# Patient Record
Sex: Female | Born: 1960 | Race: White | Hispanic: No | Marital: Single | Smoking: Former smoker
Health system: Southern US, Community
[De-identification: ages and names within clinical notes are randomized; demographics above are authoritative.]

## PROBLEM LIST (undated history)

## (undated) DIAGNOSIS — M719 Bursopathy, unspecified: Secondary | ICD-10-CM

## (undated) DIAGNOSIS — M797 Fibromyalgia: Secondary | ICD-10-CM

## (undated) DIAGNOSIS — M1990 Unspecified osteoarthritis, unspecified site: Secondary | ICD-10-CM

---

## 2000-03-13 ENCOUNTER — Other Ambulatory Visit: Admission: RE | Admit: 2000-03-13 | Discharge: 2000-03-13 | Payer: Self-pay | Admitting: Obstetrics and Gynecology

## 2001-04-02 ENCOUNTER — Other Ambulatory Visit: Admission: RE | Admit: 2001-04-02 | Discharge: 2001-04-02 | Payer: Self-pay | Admitting: Obstetrics and Gynecology

## 2003-05-09 ENCOUNTER — Encounter: Payer: Self-pay | Admitting: Obstetrics and Gynecology

## 2003-05-09 ENCOUNTER — Encounter: Admission: RE | Admit: 2003-05-09 | Discharge: 2003-05-09 | Payer: Self-pay | Admitting: Obstetrics and Gynecology

## 2006-06-18 ENCOUNTER — Encounter: Admission: RE | Admit: 2006-06-18 | Discharge: 2006-06-18 | Payer: Self-pay | Admitting: Obstetrics and Gynecology

## 2007-03-17 ENCOUNTER — Encounter (INDEPENDENT_AMBULATORY_CARE_PROVIDER_SITE_OTHER): Payer: Self-pay | Admitting: Family Medicine

## 2007-03-17 ENCOUNTER — Ambulatory Visit: Payer: Self-pay | Admitting: *Deleted

## 2007-03-17 ENCOUNTER — Ambulatory Visit (HOSPITAL_COMMUNITY): Admission: RE | Admit: 2007-03-17 | Discharge: 2007-03-17 | Payer: Self-pay | Admitting: Family Medicine

## 2007-07-06 ENCOUNTER — Encounter: Admission: RE | Admit: 2007-07-06 | Discharge: 2007-07-06 | Payer: Self-pay | Admitting: Obstetrics and Gynecology

## 2008-07-12 ENCOUNTER — Encounter: Admission: RE | Admit: 2008-07-12 | Discharge: 2008-07-12 | Payer: Self-pay | Admitting: Obstetrics and Gynecology

## 2009-08-06 ENCOUNTER — Encounter: Admission: RE | Admit: 2009-08-06 | Discharge: 2009-08-06 | Payer: Self-pay | Admitting: Obstetrics and Gynecology

## 2010-04-26 ENCOUNTER — Emergency Department (HOSPITAL_COMMUNITY): Admission: EM | Admit: 2010-04-26 | Discharge: 2010-04-26 | Payer: Self-pay | Admitting: Emergency Medicine

## 2010-10-22 ENCOUNTER — Encounter
Admission: RE | Admit: 2010-10-22 | Discharge: 2010-10-22 | Payer: Self-pay | Source: Home / Self Care | Attending: Obstetrics and Gynecology | Admitting: Obstetrics and Gynecology

## 2011-01-05 LAB — URINALYSIS, ROUTINE W REFLEX MICROSCOPIC
Ketones, ur: NEGATIVE mg/dL
Nitrite: NEGATIVE
pH: 6 (ref 5.0–8.0)

## 2011-01-05 LAB — DIFFERENTIAL
Basophils Relative: 1 % (ref 0–1)
Eosinophils Absolute: 0 10*3/uL (ref 0.0–0.7)
Eosinophils Relative: 0 % (ref 0–5)
Lymphs Abs: 2 10*3/uL (ref 0.7–4.0)
Monocytes Absolute: 0.4 10*3/uL (ref 0.1–1.0)
Monocytes Relative: 6 % (ref 3–12)
Neutrophils Relative %: 65 % (ref 43–77)

## 2011-01-05 LAB — BASIC METABOLIC PANEL
BUN: 12 mg/dL (ref 6–23)
Creatinine, Ser: 0.72 mg/dL (ref 0.4–1.2)
GFR calc Af Amer: >60 mL/min (ref >60)
GFR calc non Af Amer: >60 mL/min (ref >60)
Potassium: 4 mEq/L (ref 3.5–5.1)

## 2011-01-05 LAB — CBC
HCT: 40.3 % (ref 36.0–46.0)
MCH: 32.8 pg (ref 26.0–34.0)
MCHC: 35 g/dL (ref 30.0–36.0)
MCV: 93.7 fL (ref 78.0–100.0)
WBC: 6.9 10*3/uL (ref 4.0–10.5)

## 2011-01-05 LAB — POCT CARDIAC MARKERS: Myoglobin, poc: 63 ng/mL (ref 12–200)

## 2011-10-17 ENCOUNTER — Other Ambulatory Visit: Payer: Self-pay | Admitting: Obstetrics and Gynecology

## 2011-10-17 DIAGNOSIS — Z1231 Encounter for screening mammogram for malignant neoplasm of breast: Secondary | ICD-10-CM

## 2011-11-04 ENCOUNTER — Ambulatory Visit
Admission: RE | Admit: 2011-11-04 | Discharge: 2011-11-04 | Disposition: A | Discharge status: Home / Self Care | Payer: BC Managed Care – PPO | Source: Ambulatory Visit | Attending: Obstetrics and Gynecology | Admitting: Obstetrics and Gynecology

## 2011-11-04 DIAGNOSIS — Z1231 Encounter for screening mammogram for malignant neoplasm of breast: Secondary | ICD-10-CM

## 2012-11-24 ENCOUNTER — Other Ambulatory Visit: Payer: Self-pay | Admitting: Obstetrics and Gynecology

## 2012-11-24 DIAGNOSIS — Z1231 Encounter for screening mammogram for malignant neoplasm of breast: Secondary | ICD-10-CM

## 2012-12-13 ENCOUNTER — Ambulatory Visit: Payer: BC Managed Care – PPO

## 2012-12-17 ENCOUNTER — Ambulatory Visit
Admission: RE | Admit: 2012-12-17 | Discharge: 2012-12-17 | Disposition: A | Discharge status: Home / Self Care | Payer: BC Managed Care – PPO | Source: Ambulatory Visit | Attending: Obstetrics and Gynecology | Admitting: Obstetrics and Gynecology

## 2012-12-17 DIAGNOSIS — Z1231 Encounter for screening mammogram for malignant neoplasm of breast: Secondary | ICD-10-CM

## 2014-01-31 ENCOUNTER — Other Ambulatory Visit: Payer: Self-pay

## 2014-01-31 DIAGNOSIS — Z1231 Encounter for screening mammogram for malignant neoplasm of breast: Secondary | ICD-10-CM

## 2014-02-09 ENCOUNTER — Ambulatory Visit
Admission: RE | Admit: 2014-02-09 | Discharge: 2014-02-09 | Disposition: A | Discharge status: Home / Self Care | Payer: BC Managed Care – PPO | Source: Ambulatory Visit

## 2014-02-09 DIAGNOSIS — Z1231 Encounter for screening mammogram for malignant neoplasm of breast: Secondary | ICD-10-CM

## 2014-10-11 ENCOUNTER — Encounter (HOSPITAL_COMMUNITY): Payer: Self-pay

## 2014-10-11 ENCOUNTER — Emergency Department (HOSPITAL_COMMUNITY)
Admission: EM | Admit: 2014-10-11 | Discharge: 2014-10-11 | Disposition: A | Discharge status: Home / Self Care | Payer: BC Managed Care – PPO | Attending: Emergency Medicine | Admitting: Emergency Medicine

## 2014-10-11 ENCOUNTER — Emergency Department (HOSPITAL_COMMUNITY): Payer: BC Managed Care – PPO

## 2014-10-11 DIAGNOSIS — R079 Chest pain, unspecified: Secondary | ICD-10-CM

## 2014-10-11 DIAGNOSIS — I493 Ventricular premature depolarization: Secondary | ICD-10-CM | POA: Insufficient documentation

## 2014-10-11 DIAGNOSIS — R002 Palpitations: Secondary | ICD-10-CM

## 2014-10-11 DIAGNOSIS — J019 Acute sinusitis, unspecified: Secondary | ICD-10-CM

## 2014-10-11 DIAGNOSIS — R42 Dizziness and giddiness: Secondary | ICD-10-CM | POA: Insufficient documentation

## 2014-10-11 DIAGNOSIS — Z8739 Personal history of other diseases of the musculoskeletal system and connective tissue: Secondary | ICD-10-CM | POA: Insufficient documentation

## 2014-10-11 HISTORY — DX: Fibromyalgia: M79.7

## 2014-10-11 LAB — BASIC METABOLIC PANEL
Anion gap: 11 (ref 5–15)
BUN: 12 mg/dL (ref 6–23)
CO2: 22 mmol/L (ref 19–32)
Calcium: 9.3 mg/dL (ref 8.4–10.5)
Chloride: 109 mEq/L (ref 96–112)
Creatinine, Ser: 0.79 mg/dL (ref 0.50–1.10)
Glucose, Bld: 109 mg/dL — ABNORMAL HIGH (ref 70–99)
POTASSIUM: 4 mmol/L (ref 3.5–5.1)
SODIUM: 142 mmol/L (ref 135–145)

## 2014-10-11 LAB — CBC
HCT: 44.1 % (ref 36.0–46.0)
Hemoglobin: 14.5 g/dL (ref 12.0–15.0)
MCH: 29.8 pg (ref 26.0–34.0)
MCHC: 32.9 g/dL (ref 30.0–36.0)
MCV: 90.6 fL (ref 78.0–100.0)
Platelets: 219 10*3/uL (ref 150–400)
RBC: 4.87 MIL/uL (ref 3.87–5.11)
RDW: 13.1 % (ref 11.5–15.5)
WBC: 6.8 10*3/uL (ref 4.0–10.5)

## 2014-10-11 LAB — I-STAT TROPONIN, ED: TROPONIN I, POC: 0 ng/mL (ref 0.00–0.08)

## 2014-10-11 MED ORDER — IBUPROFEN 800 MG PO TABS
800.0000 mg | ORAL_TABLET | Freq: Once | ORAL | Status: AC
  Administered 2014-10-11: 800 mg via ORAL
  Filled 2014-10-11: qty 1

## 2014-10-11 MED ORDER — ONDANSETRON 4 MG PO TBDP: 4.0000 mg | ORAL_TABLET | Freq: Three times a day (TID) | ORAL | Status: AC | PRN

## 2014-10-11 MED ORDER — MECLIZINE HCL 25 MG PO TABS
50.0000 mg | ORAL_TABLET | Freq: Once | ORAL | Status: AC
  Administered 2014-10-11: 50 mg via ORAL
  Filled 2014-10-11: qty 2

## 2014-10-11 MED ORDER — IBUPROFEN 800 MG PO TABS: 800.0000 mg | ORAL_TABLET | Freq: Three times a day (TID) | ORAL | Status: AC | PRN

## 2014-10-11 MED ORDER — DEXAMETHASONE 4 MG PO TABS
10.0000 mg | ORAL_TABLET | Freq: Once | ORAL | Status: AC
  Administered 2014-10-11: 10 mg via ORAL
  Filled 2014-10-11: qty 3

## 2014-10-11 MED ORDER — ONDANSETRON 4 MG PO TBDP
4.0000 mg | ORAL_TABLET | Freq: Once | ORAL | Status: AC
  Administered 2014-10-11: 4 mg via ORAL
  Filled 2014-10-11: qty 1

## 2014-10-11 MED ORDER — MECLIZINE HCL 50 MG PO TABS: 50.0000 mg | ORAL_TABLET | Freq: Three times a day (TID) | ORAL | Status: AC | PRN

## 2014-10-11 NOTE — ED Provider Notes (Signed)
TIME SEEN: 2:50 PM  CHIEF COMPLAINT: Palpitations, sinus congestion, vertigo, nausea  HPI: Pt is a 53 y.o. F with history of fibromyalgia, prior history of tobacco use for 30 years who quit 3 years ago presents to the emergency department with complaints of not feeling well for the past several days. States that she has had a week of sinus congestion, sinus drainage into her oropharynx. She's also felt lightheaded and tired. States that over the past several days she has had vertigo with lying flat. No vertigo with turning her head or walking. No vertigo with sitting still. She states that she does have bilateral maxillary sinus pressure. No fevers or chills. No cough. She has had nausea but no vomiting or diarrhea. She also reports that she has felt palpitations for the past several days. States these episodes do not last more than 3-4 seconds. No prior history of arrhythmia. No history of coronary artery disease. She denies any chest pain or shortness of breath. No history of PE or DVT, lower extremity swelling or pain on exogenous estrogen use, recent prolonged immobilization such as long flight or hospitalization, fracture, injury, trauma. Denies any ear pain, tinnitus or hearing loss. Denies numbness, tingling or focal weakness. No head injury.  ROS: See HPI Constitutional: no fever  Eyes: no drainage  ENT: no runny nose   Cardiovascular:  no chest pain  Resp: no SOB  GI: no vomiting GU: no dysuria Integumentary: no rash  Allergy: no hives  Musculoskeletal: no leg swelling  Neurological: no slurred speech ROS otherwise negative  PAST MEDICAL HISTORY/PAST SURGICAL HISTORY:  Past Medical History  Diagnosis Date  . Fibromyalgia     MEDICATIONS:  Prior to Admission medications   Not on File    ALLERGIES:  No Known Allergies  SOCIAL HISTORY:  History  Substance Use Topics  . Smoking status: Never Smoker   . Smokeless tobacco: Not on file  . Alcohol Use: No    FAMILY  HISTORY: History reviewed. No pertinent family history.  EXAM: BP 139/79 mmHg  Pulse 64  Temp(Src) 97.5 F (36.4 C) (Oral)  Resp 14  Ht 5' (1.524 m)  Wt 165 lb (74.844 kg)  BMI 32.22 kg/m2  SpO2 99% CONSTITUTIONAL: Alert and oriented and responds appropriately to questions. Well-appearing; well-nourished HEAD: Normocephalic EYES: Conjunctivae clear, PERRL ENT: normal nose; no rhinorrhea; moist mucous membranes; pharynx without lesions noted, clear nasal drainage in her posterior oropharynx, no tonsillar hypertrophy or exudate, no stridor, no trismus or drooling, no uvular deviation NECK: Supple, no meningismus, no LAD  CARD: RRR; S1 and S2 appreciated; no murmurs, no clicks, no rubs, no gallops; occasional PVCs seen on the monitor RESP: Normal chest excursion without splinting or tachypnea; breath sounds clear and equal bilaterally; no wheezes, no rhonchi, no rales, no hypoxia ABD/GI: Normal bowel sounds; non-distended; soft, non-tender, no rebound, no guarding BACK:  The back appears normal and is non-tender to palpation, there is no CVA tenderness EXT: Normal ROM in all joints; non-tender to palpation; no edema; normal capillary refill; no cyanosis    SKIN: Normal color for age and race; warm NEURO: Moves all extremities equally; sensation to light touch intact diffusely, cranial nerves II through XII intact, normal gait PSYCH: The patient's mood and manner are appropriate. Grooming and personal hygiene are appropriate.  MEDICAL DECISION MAKING: Patient here with complaints of sinus headache, vertigo only with lying flat, nasal congestion. No fever. On exam she does have some very mild maxillary sinus tenderness present  otherwise well-appearing, nontoxic. Have discussed with patient that I feel like this is viral in nature at this time I do not feel that she needs antibiotics. She is using Flonase intermittently. Have started hurting and using this medicine consistently. We'll give a dose  of Decadron in the ED for symptom relief, ibuprofen, meclizine and Zofran. She is neurologically intact currently. She has no risk factors for CVA other than a prior history of tobacco use. I do not feel she needs an MRI of her brain. She is also complaining of feeling palpitations and well is in the room discussing this with patient she did have occasional PVCs. Suspect this is the cause of her symptoms as she reports becoming symptomatic when these episodes occurred on the monitor. Her electrolytes are normal. Troponin negative. Chest x-ray is clear. She states that she has been more stressed than normal and she recently lost her job in October. She states she does drink a large amount of caffeine every day, 4-6 glasses of sweet tea. Denies any other stimulant use. I doubt that this is ACS or pulmonary embolus given she has no chest pain or shortness of breath. Her EKG is nonischemic. Have as is her to avoid caffeine, drink plenty of fluids and she can follow-up with her primary care physician. Discussed strict return precautions. She verbalized understanding and is comfortable with plan.       Layla MawKristen N Trinten Boudoin, DO 10/11/14 [phone]

## 2014-10-11 NOTE — Discharge Instructions (Signed)
Palpitations A palpitation is the feeling that your heartbeat is irregular or is faster than normal. It may feel like your heart is fluttering or skipping a beat. Palpitations are usually not a serious problem. However, in some cases, you may need further medical evaluation. CAUSES  Palpitations can be caused by:  Smoking.  Caffeine or other stimulants, such as diet pills or energy drinks.  Alcohol.  Stress and anxiety.  Strenuous physical activity.  Fatigue.  Certain medicines.  Heart disease, especially if you have a history of irregular heart rhythms (arrhythmias), such as atrial fibrillation, atrial flutter, or supraventricular tachycardia.  An improperly working pacemaker or defibrillator. DIAGNOSIS  To find the cause of your palpitations, your health care provider will take your medical history and perform a physical exam. Your health care provider may also have you take a test called an ambulatory electrocardiogram (ECG). An ECG records your heartbeat patterns over a 24-hour period. You may also have other tests, such as:  Transthoracic echocardiogram (TTE). During echocardiography, sound waves are used to evaluate how blood flows through your heart.  Transesophageal echocardiogram (TEE).  Cardiac monitoring. This allows your health care provider to monitor your heart rate and rhythm in real time.  Holter monitor. This is a portable device that records your heartbeat and can help diagnose heart arrhythmias. It allows your health care provider to track your heart activity for several days, if needed.  Stress tests by exercise or by giving medicine that makes the heart beat faster. TREATMENT  Treatment of palpitations depends on the cause of your symptoms and can vary greatly. Most cases of palpitations do not require any treatment other than time, relaxation, and monitoring your symptoms. Other causes, such as atrial fibrillation, atrial flutter, or supraventricular  tachycardia, usually require further treatment. HOME CARE INSTRUCTIONS   Avoid:  Caffeinated coffee, tea, soft drinks, diet pills, and energy drinks.  Chocolate.  Alcohol.  Stop smoking if you smoke.  Reduce your stress and anxiety. Things that can help you relax include:  A method of controlling things in your body, such as your heartbeats, with your mind (biofeedback).  Yoga.  Meditation.  Physical activity such as swimming, jogging, or walking.  Get plenty of rest and sleep. SEEK MEDICAL CARE IF:   You continue to have a fast or irregular heartbeat beyond 24 hours.  Your palpitations occur more often. SEEK IMMEDIATE MEDICAL CARE IF:  You have chest pain or shortness of breath.  You have a severe headache.  You feel dizzy or you faint. MAKE SURE YOU:  Understand these instructions.  Will watch your condition.  Will get help right away if you are not doing well or get worse. Document Released: 10/03/2000 Document Revised: 10/11/2013 Document Reviewed: 12/05/2011 [facility] Patient Information 2015 [place], [place]. This information is not intended to replace advice given to you by your health care provider. Make sure you discuss any questions you have with your health care provider.  Sinusitis Sinusitis is redness, soreness, and inflammation of the paranasal sinuses. Paranasal sinuses are air pockets within the bones of your face (beneath the eyes, the middle of the forehead, or above the eyes). In healthy paranasal sinuses, mucus is able to drain out, and air is able to circulate through them by way of your nose. However, when your paranasal sinuses are inflamed, mucus and air can become trapped. This can allow bacteria and other germs to grow and cause infection. Sinusitis can develop quickly and last only a short time (acute)  or continue over a long period (chronic). Sinusitis that lasts for more than 12 weeks is considered chronic.  CAUSES  Causes of sinusitis  include:  Allergies.  Structural abnormalities, such as displacement of the cartilage that separates your nostrils (deviated septum), which can decrease the air flow through your nose and sinuses and affect sinus drainage.  Functional abnormalities, such as when the small hairs (cilia) that line your sinuses and help remove mucus do not work properly or are not present. SIGNS AND SYMPTOMS  Symptoms of acute and chronic sinusitis are the same. The primary symptoms are pain and pressure around the affected sinuses. Other symptoms include:  Upper toothache.  Earache.  Headache.  Bad breath.  Decreased sense of smell and taste.  A cough, which worsens when you are lying flat.  Fatigue.  Fever.  Thick drainage from your nose, which often is green and may contain pus (purulent).  Swelling and warmth over the affected sinuses. DIAGNOSIS  Your health care provider will perform a physical exam. During the exam, your health care provider may:  Look in your nose for signs of abnormal growths in your nostrils (nasal polyps).  Tap over the affected sinus to check for signs of infection.  View the inside of your sinuses (endoscopy) using an imaging device that has a light attached (endoscope). If your health care provider suspects that you have chronic sinusitis, one or more of the following tests may be recommended:  Allergy tests.  Nasal culture. A sample of mucus is taken from your nose, sent to a lab, and screened for bacteria.  Nasal cytology. A sample of mucus is taken from your nose and examined by your health care provider to determine if your sinusitis is related to an allergy. TREATMENT  Most cases of acute sinusitis are related to a viral infection and will resolve on their own within 10 days. Sometimes medicines are prescribed to help relieve symptoms (pain medicine, decongestants, nasal steroid sprays, or saline sprays).  However, for sinusitis related to a bacterial  infection, your health care provider will prescribe antibiotic medicines. These are medicines that will help kill the bacteria causing the infection.  Rarely, sinusitis is caused by a fungal infection. In theses cases, your health care provider will prescribe antifungal medicine. For some cases of chronic sinusitis, surgery is needed. Generally, these are cases in which sinusitis recurs more than 3 times per year, despite other treatments. HOME CARE INSTRUCTIONS   Drink plenty of water. Water helps thin the mucus so your sinuses can drain more easily.  Use a humidifier.  Inhale steam 3 to 4 times a day (for example, sit in the bathroom with the shower running).  Apply a warm, moist washcloth to your face 3 to 4 times a day, or as directed by your health care provider.  Use saline nasal sprays to help moisten and clean your sinuses.  Take medicines only as directed by your health care provider.  If you were prescribed either an antibiotic or antifungal medicine, finish it all even if you start to feel better. SEEK IMMEDIATE MEDICAL CARE IF:  You have increasing pain or severe headaches.  You have nausea, vomiting, or drowsiness.  You have swelling around your face.  You have vision problems.  You have a stiff neck.  You have difficulty breathing. MAKE SURE YOU:   Understand these instructions.  Will watch your condition.  Will get help right away if you are not doing well or get worse. Document  Released: 10/06/2005 Document Revised: 02/20/2014 Document Reviewed: 10/21/2011 [facility] Patient Information 2015 [place], [place]. This information is not intended to replace advice given to you by your health care provider. Make sure you discuss any questions you have with your health care provider.  Vertigo Vertigo means you feel like you or your surroundings are moving when they are not. Vertigo can be dangerous if it occurs when you are at work, driving, or performing difficult  activities.  CAUSES  Vertigo occurs when there is a conflict of signals sent to your brain from the visual and sensory systems in your body. There are many different causes of vertigo, including:  Infections, especially in the inner ear.  A bad reaction to a drug or misuse of alcohol and medicines.  Withdrawal from drugs or alcohol.  Rapidly changing positions, such as lying down or rolling over in bed.  A migraine headache.  Decreased blood flow to the brain.  Increased pressure in the brain from a head injury, infection, tumor, or bleeding. SYMPTOMS  You may feel as though the world is spinning around or you are falling to the ground. Because your balance is upset, vertigo can cause nausea and vomiting. You may have involuntary eye movements (nystagmus). DIAGNOSIS  Vertigo is usually diagnosed by physical exam. If the cause of your vertigo is unknown, your caregiver may perform imaging tests, such as an MRI scan (magnetic resonance imaging). TREATMENT  Most cases of vertigo resolve on their own, without treatment. Depending on the cause, your caregiver may prescribe certain medicines. If your vertigo is related to body position issues, your caregiver may recommend movements or procedures to correct the problem. In rare cases, if your vertigo is caused by certain inner ear problems, you may need surgery. HOME CARE INSTRUCTIONS   Follow your caregiver's instructions.  Avoid driving.  Avoid operating heavy machinery.  Avoid performing any tasks that would be dangerous to you or others during a vertigo episode.  Tell your caregiver if you notice that certain medicines seem to be causing your vertigo. Some of the medicines used to treat vertigo episodes can actually make them worse in some people. SEEK IMMEDIATE MEDICAL CARE IF:   Your medicines do not relieve your vertigo or are making it worse.  You develop problems with talking, walking, weakness, or using your arms, hands, or  legs.  You develop severe headaches.  Your nausea or vomiting continues or gets worse.  You develop visual changes.  A family member notices behavioral changes.  Your condition gets worse. MAKE SURE YOU:  Understand these instructions.  Will watch your condition.  Will get help right away if you are not doing well or get worse. Document Released: 07/16/2005 Document Revised: 12/29/2011 Document Reviewed: 04/24/2011 [facility] Patient Information 2015 [place], [place]. This information is not intended to replace advice given to you by your health care provider. Make sure you discuss any questions you have with your health care provider.  Premature Ventricular Contraction Premature ventricular contraction (PVC) is an irregularity of the heart rhythm involving extra or skipped heartbeats. In some cases, they may occur without obvious cause or heart disease. Other times, they can be caused by an electrolyte change in the blood. These need to be corrected. They can also be seen when there is not enough oxygen going to the heart. A common cause of this is plaque or cholesterol buildup. This buildup decreases the blood supply to the heart. In addition, extra beats may be caused or aggravated by:  Excessive smoking.  Alcohol consumption.  Caffeine.  Certain medications  Some street drugs. SYMPTOMS   The sensation of feeling your heart skipping a beat (palpitations).  In many cases, the person may have no symptoms. SIGNS AND TESTS   A physical examination may show an occasional irregularity, but if the PVC beats do not happen often, they may not be found on physical exam.  Blood pressure is usually normal.  Other tests that may find extra beats of the heart are:  An EKG (electrocardiogram)  A Holter monitor which can monitor your heart over longer periods of time  An Angiogram (study of the heart arteries). TREATMENT  Usually extra heartbeats do not need treatment. The  condition is treated only if symptoms are severe or if extra beats are very frequent or are causing problems. An underlying cause, if discovered, may also require treatment.  Treatment may also be needed if there may be a risk for other more serious cardiac arrhythmias.  PREVENTION   Moderation in caffeine, alcohol, and tobacco use may reduce the risk of ectopic heartbeats in some people.  Exercise often helps people who lead a sedentary (inactive) lifestyle. PROGNOSIS  PVC heartbeats are generally harmless and do not need treatment.  RISKS AND COMPLICATIONS   Ventricular tachycardia (occasionally).  There usually are no complications.  Other arrhythmias (occasionally). SEEK IMMEDIATE MEDICAL CARE IF:   You feel palpitations that are frequent or continual.  You develop chest pain or other problems such as shortness of breath, sweating, or nausea and vomiting.  You become light-headed or faint (pass out).  You get worse or do not improve with treatment. Document Released: 05/23/2004 Document Revised: 12/29/2011 Document Reviewed: 12/03/2007 [facility] Patient Information 2015 [place], [place]. This information is not intended to replace advice given to you by your health care provider. Make sure you discuss any questions you have with your health care provider.

## 2014-10-11 NOTE — ED Notes (Signed)
Pt from home with chest pressure and face pain that started several days ago.  Pt sts pain in face started first "feels like my sinuses are congested" and that chest pain started later.  Pt describes pain as pressure in nature no radiation.  Sts that she has been feeling dizzy with chest pain/pressure.  No other s/s reported.

## 2014-10-11 NOTE — ED Notes (Signed)
Pt comfortable with discharge and follow up instructions. Pt declines wheelchair, escorted to waiting area by this RN. Prescriptions x3. 

## 2015-12-03 ENCOUNTER — Encounter (HOSPITAL_COMMUNITY): Payer: Self-pay | Admitting: Emergency Medicine

## 2015-12-03 ENCOUNTER — Emergency Department (HOSPITAL_COMMUNITY)
Admission: EM | Admit: 2015-12-03 | Discharge: 2015-12-03 | Disposition: A | Discharge status: Home / Self Care | Payer: Commercial Managed Care - PPO | Attending: Emergency Medicine | Admitting: Emergency Medicine

## 2015-12-03 DIAGNOSIS — R1031 Right lower quadrant pain: Secondary | ICD-10-CM | POA: Diagnosis present

## 2015-12-03 LAB — COMPREHENSIVE METABOLIC PANEL
ALT: 24 U/L (ref 14–54)
AST: 24 U/L (ref 15–41)
Albumin: 3.7 g/dL (ref 3.5–5.0)
Alkaline Phosphatase: 75 U/L (ref 38–126)
Anion gap: 13 (ref 5–15)
BUN: 14 mg/dL (ref 6–20)
CALCIUM: 9.3 mg/dL (ref 8.9–10.3)
CHLORIDE: 105 mmol/L (ref 101–111)
CO2: 24 mmol/L (ref 22–32)
CREATININE: 0.78 mg/dL (ref 0.44–1.00)
Glucose, Bld: 89 mg/dL (ref 65–99)
Potassium: 3.8 mmol/L (ref 3.5–5.1)
SODIUM: 142 mmol/L (ref 135–145)
Total Bilirubin: 0.5 mg/dL (ref 0.3–1.2)
Total Protein: 7.1 g/dL (ref 6.5–8.1)

## 2015-12-03 LAB — CBC
HCT: 42.7 % (ref 36.0–46.0)
Hemoglobin: 14.2 g/dL (ref 12.0–15.0)
MCH: 30.3 pg (ref 26.0–34.0)
MCHC: 33.3 g/dL (ref 30.0–36.0)
MCV: 91 fL (ref 78.0–100.0)
Platelets: 217 K/uL (ref 150–400)
RBC: 4.69 MIL/uL (ref 3.87–5.11)
RDW: 13.6 % (ref 11.5–15.5)
WBC: 7.6 K/uL (ref 4.0–10.5)

## 2015-12-03 LAB — LIPASE, BLOOD: Lipase: 26 U/L (ref 11–51)

## 2015-12-03 NOTE — ED Notes (Signed)
Pt states since yesterday she has had a sharp pain in her RLQ and now is just really sore. PCP sent pt here to rule out appendicitis. Pt denies any n/v/d.

## 2015-12-04 ENCOUNTER — Ambulatory Visit
Admission: RE | Admit: 2015-12-04 | Discharge: 2015-12-04 | Disposition: A | Discharge status: Home / Self Care | Payer: Commercial Managed Care - PPO | Source: Ambulatory Visit | Attending: Family Medicine | Admitting: Family Medicine

## 2015-12-04 ENCOUNTER — Other Ambulatory Visit: Payer: Self-pay | Admitting: Family Medicine

## 2015-12-04 DIAGNOSIS — R1084 Generalized abdominal pain: Secondary | ICD-10-CM

## 2015-12-04 MED ORDER — IOPAMIDOL (ISOVUE-300) INJECTION 61%
100.0000 mL | Freq: Once | INTRAVENOUS | Status: AC | PRN
  Administered 2015-12-04: 100 mL via INTRAVENOUS

## 2017-07-10 IMAGING — CT CT ABD-PELV W/ CM
1 of 3 series · 13 of 32 positions shown, 18 images · IV contrast (APPLIED)
Comparison: None.

CLINICAL DATA: Right lower abdomen pain for 3 days

EXAM:
CT ABDOMEN AND PELVIS WITH CONTRAST
TECHNIQUE: Multidetector CT imaging of the abdomen and pelvis was performed
using the standard protocol following bolus administration of
intravenous contrast.
CONTRAST:  100mL 32LVT4-AWW IOPAMIDOL (32LVT4-AWW) INJECTION 61%

[Series 2: abd/pelvis w/cm · axial · 0.75mm/px · z∈[-449,-39]mm · 13 of 92 slices shown, 18 images]
[im 5/92  soft-tissue]
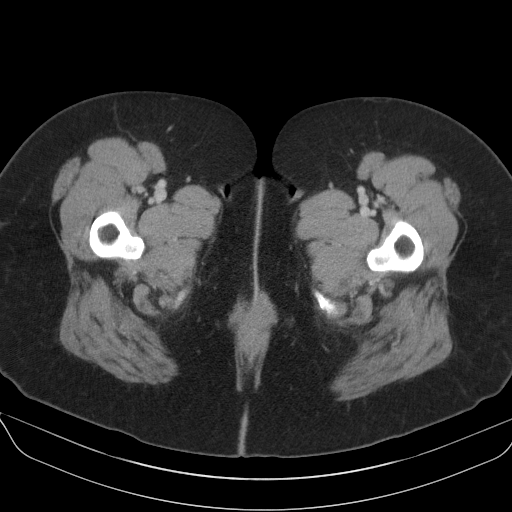
[im 5/92  bone]
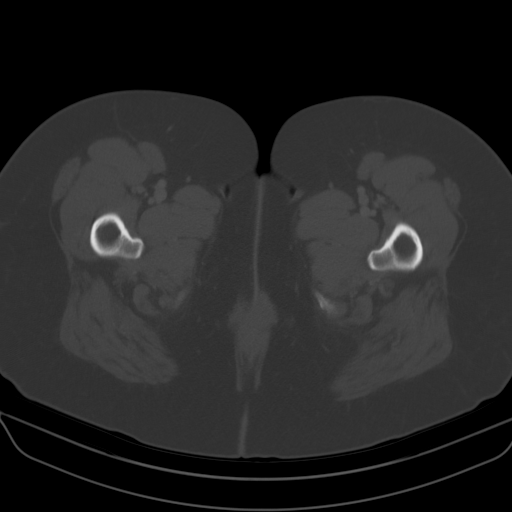
[im 14/92  soft-tissue]
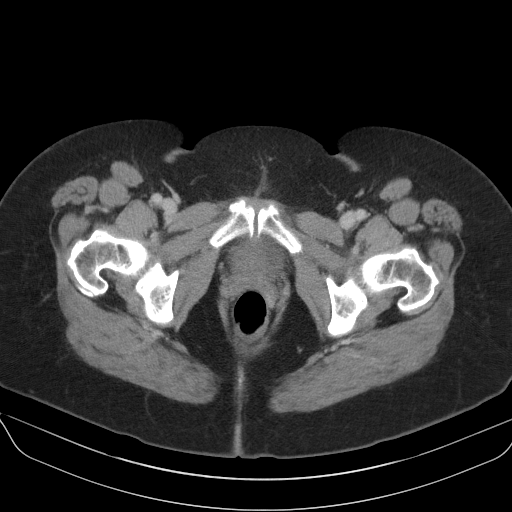
[im 19/92  soft-tissue]
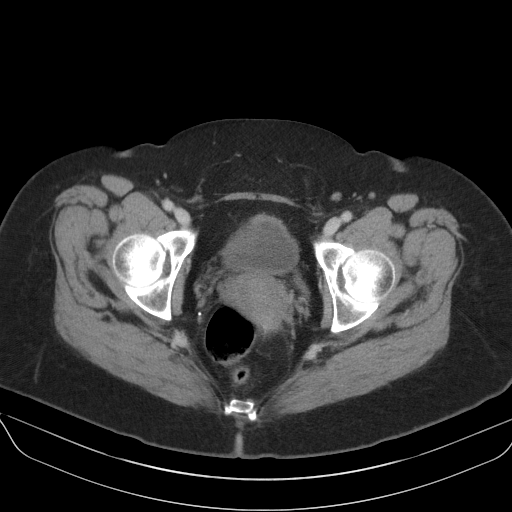
[im 28/92  soft-tissue]
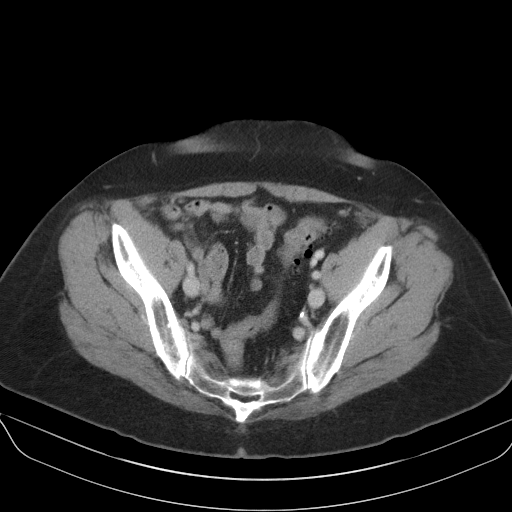
[im 37/92  soft-tissue]
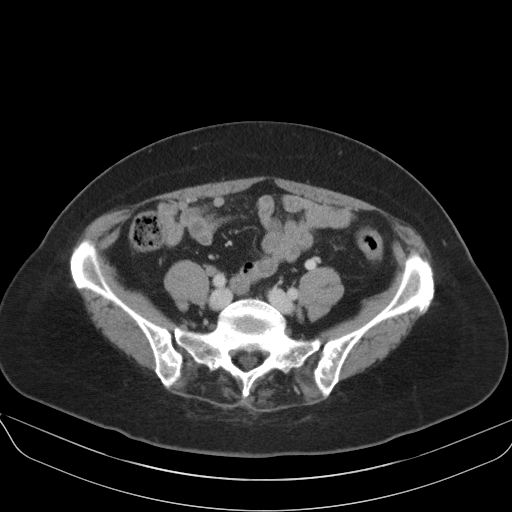
[im 41/92  soft-tissue]
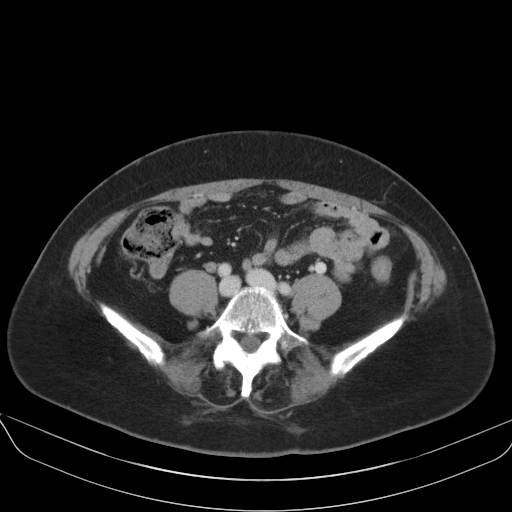
[im 51/92  soft-tissue]
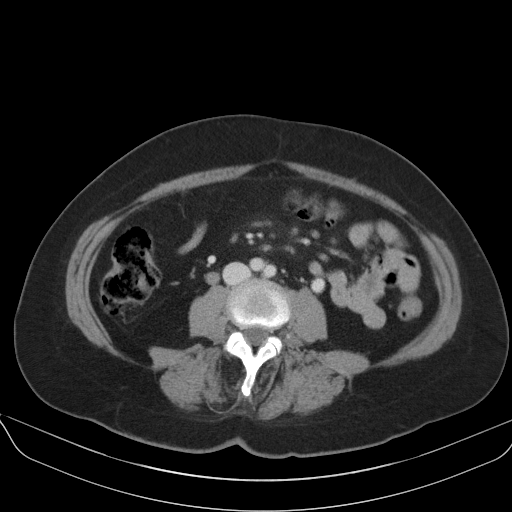
[im 55/92  soft-tissue]
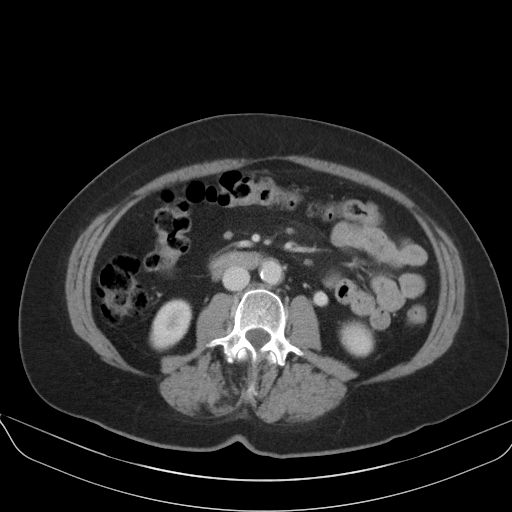
[im 64/92  soft-tissue]
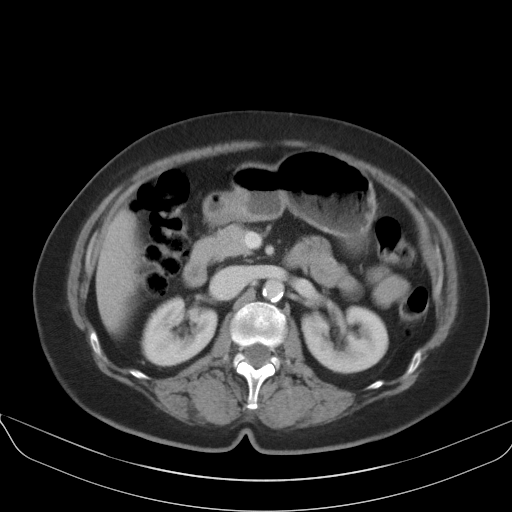
[im 64/92  bone]
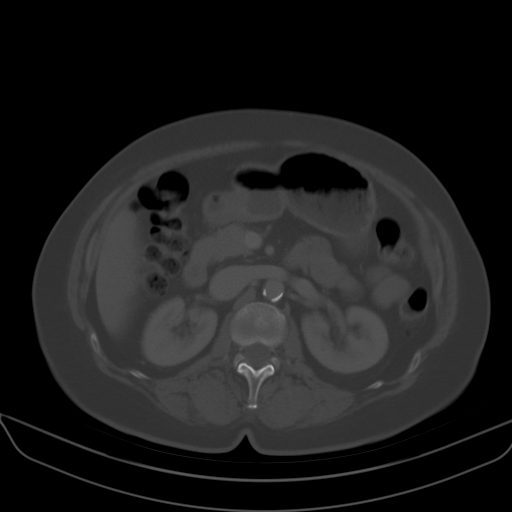
[im 73/92  soft-tissue]
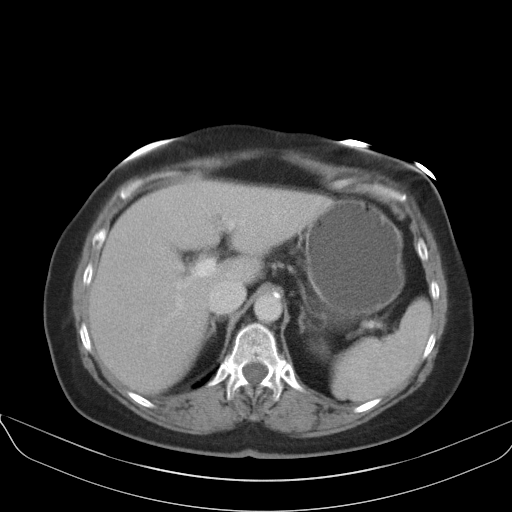
[im 73/92  lung]
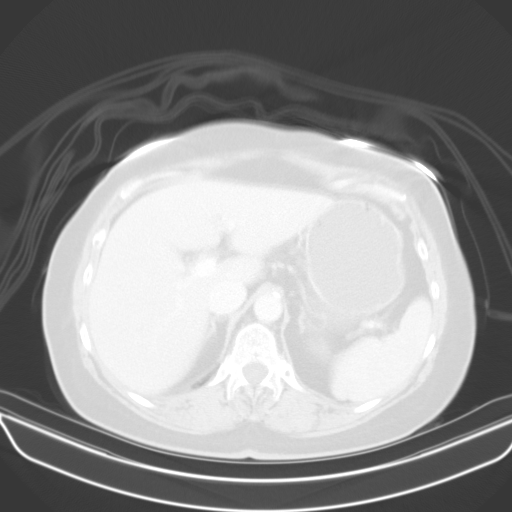
[im 78/92  soft-tissue]
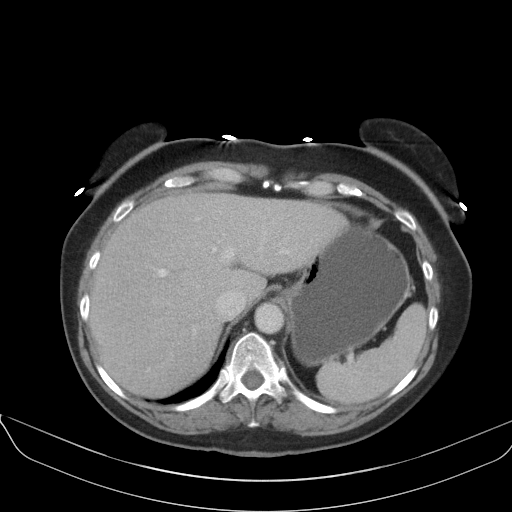
[im 78/92  lung]
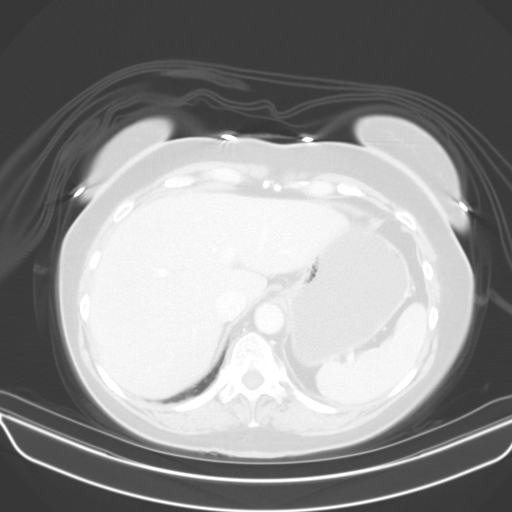
[im 82/92  lung]
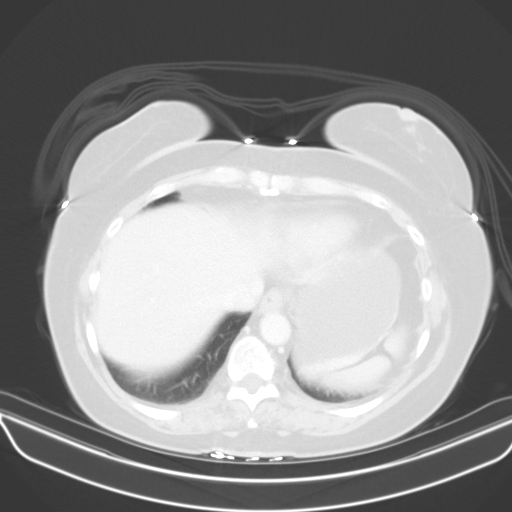
[im 87/92  soft-tissue]
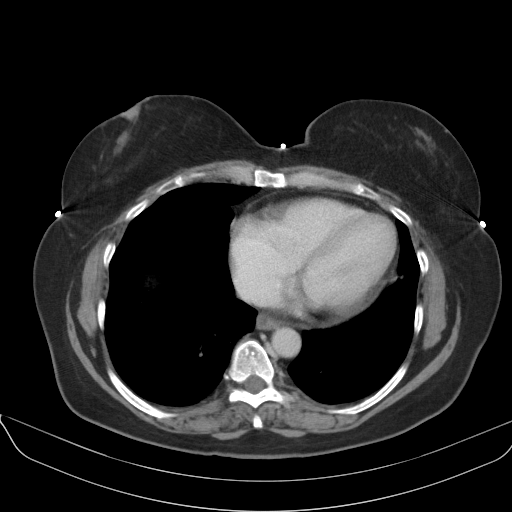
[im 87/92  lung]
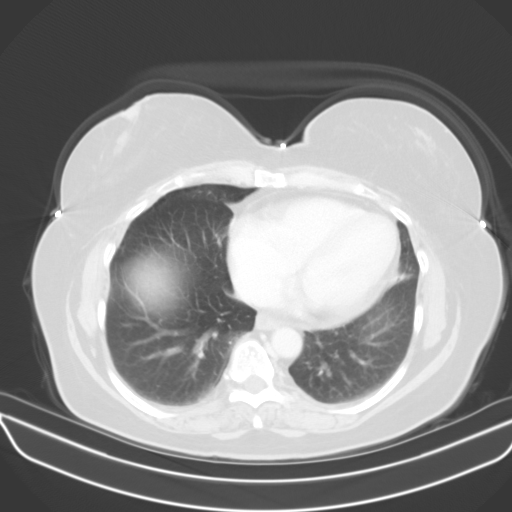

[13 of 32 positions shown; findings below may reference images not displayed]

FINDINGS: At the lung bases there is a patchy appearance of the lungs which
may indicate small airways disease and clinical correlation is
recommended. The heart is within upper limits of normal. The liver
enhances with no focal abnormality and no ductal dilatation is seen.
The there is some debris layering in the neck of the gallbladder
which could represent noncalcified gallstones or possibly sludge.
The pancreas is normal in size and the pancreatic duct is not
dilated. The adrenal glands and spleen are unremarkable. The stomach
is moderately distended with fluid and no abnormality is seen. The
kidneys enhance. No renal calculi are seen and there is no evidence
of hydronephrosis. The abdominal aorta is normal in caliber with
moderate atherosclerotic change present. No adenopathy is seen.

The uterus is normal in size. No adnexal lesion is seen and no free
fluid is noted within the pelvis. The urinary bladder is not well
distended. There are rectosigmoid diverticula present but no
diverticulitis is seen. The terminal ileum is unremarkable. The
appendix is moderately well seen although lying adjacent to small
bowel loops, within the right pelvis. There is no present evidence
to indicate acute appendicitis. The lumbar vertebrae are in normal
alignment with normal intervertebral disc spaces.
IMPRESSION: 1. No explanation for the patient's right lower quadrant pain is
seen. The appendix is moderately well visualized with no present
evidence of acute appendicitis by CT.
2. The terminal ileum is unremarkable.
3. Diverticula in the rectosigmoid colon. No diverticulitis is seen.
4. Patchy mosaic profusion at the lung bases. Cannot exclude small
airways disease.
5. Debris within the gallbladder could represent gallbladder sludge
or noncalcified gallstones.

## 2017-08-04 ENCOUNTER — Encounter (HOSPITAL_COMMUNITY): Payer: Self-pay | Admitting: Emergency Medicine

## 2017-08-04 ENCOUNTER — Emergency Department (HOSPITAL_COMMUNITY)
Admission: EM | Admit: 2017-08-04 | Discharge: 2017-08-04 | Disposition: A | Discharge status: Home / Self Care | Payer: Commercial Managed Care - PPO | Attending: Emergency Medicine | Admitting: Emergency Medicine

## 2017-08-04 DIAGNOSIS — Z87891 Personal history of nicotine dependence: Secondary | ICD-10-CM | POA: Insufficient documentation

## 2017-08-04 DIAGNOSIS — L02213 Cutaneous abscess of chest wall: Secondary | ICD-10-CM | POA: Diagnosis present

## 2017-08-04 DIAGNOSIS — Z23 Encounter for immunization: Secondary | ICD-10-CM | POA: Insufficient documentation

## 2017-08-04 DIAGNOSIS — L0291 Cutaneous abscess, unspecified: Secondary | ICD-10-CM

## 2017-08-04 HISTORY — DX: Unspecified osteoarthritis, unspecified site: M19.90

## 2017-08-04 HISTORY — DX: Bursopathy, unspecified: M71.9

## 2017-08-04 MED ORDER — LIDOCAINE-EPINEPHRINE (PF) 2 %-1:200000 IJ SOLN
20.0000 mL | Freq: Once | INTRAMUSCULAR | Status: AC
  Administered 2017-08-04: 20 mL
  Filled 2017-08-04: qty 20

## 2017-08-04 MED ORDER — TETANUS-DIPHTH-ACELL PERTUSSIS 5-2.5-18.5 LF-MCG/0.5 IM SUSP
0.5000 mL | Freq: Once | INTRAMUSCULAR | Status: AC
  Administered 2017-08-04: 0.5 mL via INTRAMUSCULAR
  Filled 2017-08-04: qty 0.5

## 2017-08-04 NOTE — Discharge Instructions (Signed)
As discussed, continue taking your antibiotics as prescribed and pain medications as needed. Keep the area clean and dry with clean dressing. Follow-up with your primary care provider, urgent care or emergency Department in 48 hours for wound recheck. Monitor for any signs of worsening surrounding infection including swelling, redness, warmth, increased purulence, fever, chills and return to be seen if you experience any of these in the meantime.

## 2017-08-04 NOTE — ED Provider Notes (Signed)
[facility]-EMERGENCY DEPT Provider Note   CSN: [id number] Arrival date & time: 08/04/17  4696     History   Chief Complaint Chief Complaint  Patient presents with  . Abscess    HPI Carmen Reyes is a 56 y.o. female with no significant past medical history presenting from PCP with worsening right lower chest wall abscess. Patient reports that she had an incision and drainage on Sunday 2 days ago which only yielded bloody discharge. He was started on antibiotics doxycycline and sent home with pain management. Today she noted that the area has significantly increased in size and worsening pain with purulence. Primary care provider referred her to general surgery outpatient for incision and drainage with ultrasound. General surgery recommended emergency Department due to description and size. Patient explains that it just started to express purulence since she was seen earlier today. Area is currently draining. She denies any fever, chills, nausea, vomiting or other symptoms.  HPI  Past Medical History:  Diagnosis Date  . Arthritis   . Bursitis   . Fibromyalgia     There are no active problems to display for this patient.   History reviewed. No pertinent surgical history.  OB History    No data available       Home Medications    Prior to Admission medications   Medication Sig Start Date End Date Taking? Authorizing Provider  fluticasone (FLONASE) 50 MCG/ACT nasal spray Place 2 sprays into both nostrils daily as needed for allergies or rhinitis.    [provider]  ibuprofen (ADVIL,MOTRIN) 800 MG tablet Take 1 tablet (800 mg total) by mouth every 8 (eight) hours as needed for mild pain. 10/11/14   Ward, Layla Maw, DO  meclizine (ANTIVERT) 50 MG tablet Take 1 tablet (50 mg total) by mouth 3 (three) times daily as needed for dizziness. 10/11/14   Ward, Layla Maw, DO  omeprazole (PRILOSEC) 20 MG capsule Take 20 mg by mouth daily as needed (for  heartburn).    [provider]  ondansetron (ZOFRAN ODT) 4 MG disintegrating tablet Take 1 tablet (4 mg total) by mouth every 8 (eight) hours as needed for nausea or vomiting. 10/11/14   Ward, Layla Maw, DO    Family History History reviewed. No pertinent family history.  Social History Social History  Substance Use Topics  . Smoking status: Former Smoker    Quit date: 12/02/2012  . Smokeless tobacco: Never Used  . Alcohol use No     Allergies   Patient has no known allergies.   Review of Systems Review of Systems  Constitutional: Negative for chills and fever.  Respiratory: Negative for shortness of breath, wheezing and stridor.   Cardiovascular: Negative for chest pain.  Gastrointestinal: Negative for nausea and vomiting.  Musculoskeletal: Positive for myalgias.  Skin: Positive for color change and wound. Negative for pallor and rash.     Physical Exam Updated Vital Signs BP 96/73   Pulse 68   Temp 98.1 F (36.7 C) (Oral)   Resp 18   Ht 5' (1.524 m)   Wt 69.9 kg (154 lb)   SpO2 94%   BMI 30.08 kg/m   Physical Exam  Constitutional: She appears well-developed and well-nourished. No distress.  Afebrile, nontoxic-appearing, sitting comfortably in chair in no acute distress.  HENT:  Head: Normocephalic and atraumatic.  Neck: Neck supple.  Cardiovascular: Normal rate, regular rhythm and normal heart sounds.   No murmur heard. Pulmonary/Chest: Effort normal and breath sounds  normal. No respiratory distress. She has no wheezes.  Abdominal: She exhibits no distension.  Musculoskeletal: She exhibits no edema.  Neurological: She is alert.  Skin: Skin is warm and dry. No rash noted. She is not diaphoretic. There is erythema. No pallor.  Approximately 5 cm area of edema and erythema with central fluctuance and purulent drainage on lower right anterior wall below the breast. Without breast tissue involvement.  Psychiatric: She has a normal mood and affect.    Nursing note and vitals reviewed.    ED Treatments / Results  Labs (all labs ordered are listed, but only abnormal results are displayed) Labs Reviewed - No data to display  EKG  EKG Interpretation None       Radiology No results found. EMERGENCY DEPARTMENT US SOFT TISSUE INTERPRETATION "Study: Limited Soft Tissue Ultrasound"  INDICATIONS: Soft tissue infection Multiple views of the body part were obtained in real-time with a multi-frequency linear probe  PERFORMED BY: Myself IMAGES ARCHIVED?: Yes SIDE:Right  BODY PART:Chest wall INTERPRETATION:  Abcess present and Cellulitis present   Procedures Procedures (including critical care time) INCISION AND DRAINAGE Performed by: Georgiana Shore Consent: Verbal consent obtained. Risks and benefits: risks, benefits and alternatives were discussed Type: abscess  Body area: Right lower anterior chest wall  Anesthesia: local infiltration  Incision was made with a scalpel.  Local anesthetic: lidocaine 2% with epinephrine  Anesthetic total: 5 ml  Complexity: complex Blunt dissection to break up loculations  Drainage: purulent  Drainage amount: 4 mL  Packing material: 1/4 in iodoform gauze  Patient tolerance: Patient tolerated the procedure well with no immediate complications.    Medications Ordered in ED Medications  lidocaine-EPINEPHrine (XYLOCAINE W/EPI) 2 %-1:200000 (PF) injection 20 mL (20 mLs Infiltration Given by Other 08/04/17 2152)  Tdap (BOOSTRIX) injection 0.5 mL (0.5 mLs Intramuscular Given 08/04/17 2149)     Initial Impression / Assessment and Plan / ED Course  I have reviewed the triage vital signs and the nursing notes.  Pertinent labs & imaging results that were available during my care of the patient were reviewed by me and considered in my medical decision making (see chart for details).    Patient with skin abscess amenable to incision and drainage.  Abscess was large enough to  warrant packing, wound recheck in 2 days.  signs of cellulitis is surrounding skin.  Will d/c to home.  Antibiotics as previously prescribed.  Discussed strict return precautions and advised to return to the emergency department if experiencing any new or worsening symptoms. Instructions were understood and patient agreed with discharge plan.  Final Clinical Impressions(s) / ED Diagnoses   Final diagnoses:  Abscess    New Prescriptions New Prescriptions   No medications on file     Gregary Cromer 08/04/17 2329    Arby Barrette, MD 08/05/17 [phone]

## 2019-07-26 DIAGNOSIS — R350 Frequency of micturition: Secondary | ICD-10-CM | POA: Diagnosis not present

## 2019-09-30 DIAGNOSIS — H6123 Impacted cerumen, bilateral: Secondary | ICD-10-CM | POA: Diagnosis not present

## 2019-10-11 DIAGNOSIS — Z13220 Encounter for screening for lipoid disorders: Secondary | ICD-10-CM | POA: Diagnosis not present

## 2019-10-11 DIAGNOSIS — Z0000 Encounter for general adult medical examination without abnormal findings: Secondary | ICD-10-CM | POA: Diagnosis not present

## 2019-10-31 DIAGNOSIS — Z1211 Encounter for screening for malignant neoplasm of colon: Secondary | ICD-10-CM | POA: Diagnosis not present

## 2019-10-31 DIAGNOSIS — R198 Other specified symptoms and signs involving the digestive system and abdomen: Secondary | ICD-10-CM | POA: Diagnosis not present

## 2019-11-21 DIAGNOSIS — Z1159 Encounter for screening for other viral diseases: Secondary | ICD-10-CM | POA: Diagnosis not present

## 2019-11-24 DIAGNOSIS — K5730 Diverticulosis of large intestine without perforation or abscess without bleeding: Secondary | ICD-10-CM | POA: Diagnosis not present

## 2019-11-24 DIAGNOSIS — K6289 Other specified diseases of anus and rectum: Secondary | ICD-10-CM | POA: Diagnosis not present

## 2019-11-24 DIAGNOSIS — Z1211 Encounter for screening for malignant neoplasm of colon: Secondary | ICD-10-CM | POA: Diagnosis not present

## 2019-11-24 DIAGNOSIS — K6389 Other specified diseases of intestine: Secondary | ICD-10-CM | POA: Diagnosis not present

## 2019-12-10 DIAGNOSIS — H6123 Impacted cerumen, bilateral: Secondary | ICD-10-CM | POA: Diagnosis not present

## 2019-12-10 DIAGNOSIS — H9202 Otalgia, left ear: Secondary | ICD-10-CM | POA: Diagnosis not present

## 2019-12-10 DIAGNOSIS — H6693 Otitis media, unspecified, bilateral: Secondary | ICD-10-CM | POA: Diagnosis not present

## 2019-12-19 DIAGNOSIS — L570 Actinic keratosis: Secondary | ICD-10-CM | POA: Diagnosis not present

## 2020-01-24 DIAGNOSIS — H61893 Other specified disorders of external ear, bilateral: Secondary | ICD-10-CM | POA: Diagnosis not present

## 2020-02-13 DIAGNOSIS — R195 Other fecal abnormalities: Secondary | ICD-10-CM | POA: Diagnosis not present

## 2020-02-13 DIAGNOSIS — K589 Irritable bowel syndrome without diarrhea: Secondary | ICD-10-CM | POA: Diagnosis not present

## 2020-02-13 DIAGNOSIS — K639 Disease of intestine, unspecified: Secondary | ICD-10-CM | POA: Diagnosis not present

## 2020-02-13 DIAGNOSIS — K648 Other hemorrhoids: Secondary | ICD-10-CM | POA: Diagnosis not present

## 2020-02-21 DIAGNOSIS — L821 Other seborrheic keratosis: Secondary | ICD-10-CM | POA: Diagnosis not present

## 2020-02-21 DIAGNOSIS — L570 Actinic keratosis: Secondary | ICD-10-CM | POA: Diagnosis not present

## 2020-02-21 DIAGNOSIS — D225 Melanocytic nevi of trunk: Secondary | ICD-10-CM | POA: Diagnosis not present

## 2020-02-21 DIAGNOSIS — L578 Other skin changes due to chronic exposure to nonionizing radiation: Secondary | ICD-10-CM | POA: Diagnosis not present

## 2020-02-21 DIAGNOSIS — L814 Other melanin hyperpigmentation: Secondary | ICD-10-CM | POA: Diagnosis not present

## 2020-02-27 DIAGNOSIS — H6123 Impacted cerumen, bilateral: Secondary | ICD-10-CM | POA: Diagnosis not present

## 2020-02-27 DIAGNOSIS — J302 Other seasonal allergic rhinitis: Secondary | ICD-10-CM | POA: Diagnosis not present

## 2020-02-27 DIAGNOSIS — H6983 Other specified disorders of Eustachian tube, bilateral: Secondary | ICD-10-CM | POA: Diagnosis not present

## 2020-04-18 DIAGNOSIS — K589 Irritable bowel syndrome without diarrhea: Secondary | ICD-10-CM | POA: Diagnosis not present

## 2020-04-18 DIAGNOSIS — K639 Disease of intestine, unspecified: Secondary | ICD-10-CM | POA: Diagnosis not present

## 2020-04-18 DIAGNOSIS — K648 Other hemorrhoids: Secondary | ICD-10-CM | POA: Diagnosis not present

## 2020-04-18 DIAGNOSIS — R195 Other fecal abnormalities: Secondary | ICD-10-CM | POA: Diagnosis not present

## 2020-06-29 DIAGNOSIS — H6123 Impacted cerumen, bilateral: Secondary | ICD-10-CM | POA: Diagnosis not present

## 2020-06-29 DIAGNOSIS — J302 Other seasonal allergic rhinitis: Secondary | ICD-10-CM | POA: Diagnosis not present

## 2020-07-17 DIAGNOSIS — B079 Viral wart, unspecified: Secondary | ICD-10-CM | POA: Diagnosis not present

## 2020-07-17 DIAGNOSIS — D485 Neoplasm of uncertain behavior of skin: Secondary | ICD-10-CM | POA: Diagnosis not present

## 2020-07-17 DIAGNOSIS — L570 Actinic keratosis: Secondary | ICD-10-CM | POA: Diagnosis not present

## 2020-11-27 ENCOUNTER — Other Ambulatory Visit: Payer: Self-pay | Admitting: Obstetrics and Gynecology

## 2020-11-27 DIAGNOSIS — E2839 Other primary ovarian failure: Secondary | ICD-10-CM

## 2021-04-24 ENCOUNTER — Other Ambulatory Visit: Payer: Self-pay | Admitting: Family Medicine

## 2021-04-24 ENCOUNTER — Ambulatory Visit
Admission: RE | Admit: 2021-04-24 | Discharge: 2021-04-24 | Disposition: A | Discharge status: Home / Self Care | Payer: Managed Care, Other (non HMO) | Source: Ambulatory Visit | Attending: Family Medicine | Admitting: Family Medicine

## 2021-04-24 DIAGNOSIS — M79671 Pain in right foot: Secondary | ICD-10-CM

## 2021-04-26 ENCOUNTER — Other Ambulatory Visit: Payer: Self-pay

## 2021-04-26 ENCOUNTER — Ambulatory Visit
Admission: RE | Admit: 2021-04-26 | Discharge: 2021-04-26 | Disposition: A | Discharge status: Home / Self Care | Payer: Managed Care, Other (non HMO) | Source: Ambulatory Visit | Attending: Obstetrics and Gynecology | Admitting: Obstetrics and Gynecology

## 2021-04-26 DIAGNOSIS — E2839 Other primary ovarian failure: Secondary | ICD-10-CM

## 2022-10-28 ENCOUNTER — Ambulatory Visit (INDEPENDENT_AMBULATORY_CARE_PROVIDER_SITE_OTHER): Payer: Managed Care, Other (non HMO)

## 2022-10-28 ENCOUNTER — Ambulatory Visit: Payer: Managed Care, Other (non HMO) | Admitting: Podiatry

## 2022-10-28 VITALS — BP 128/70

## 2022-10-28 DIAGNOSIS — M7661 Achilles tendinitis, right leg: Secondary | ICD-10-CM

## 2022-10-28 NOTE — Progress Notes (Signed)
  Subjective:  Patient ID: Carmen Reyes, female    DOB: [birth year],  MRN: [id number]  Chief Complaint  Patient presents with   Foot Pain    Bilateral heel pain right is worse than left pain is felt on top of foot     62 y.o. female presents with the above complaint.  Patient presents with complaint of right Achilles tendinitis.  Patient states is painful to touch is progressive gotten worse worse with ambulation hurts with pressure.  She has not seen MRIs prior to seeing me.  Pain scale 7 out of 10.  Dull achy in nature   Review of Systems: Negative except as noted in the HPI. Denies N/V/F/Ch.  Past Medical History:  Diagnosis Date   Arthritis    Bursitis    Fibromyalgia     Current Outpatient Medications:    fluticasone (FLONASE) 50 MCG/ACT nasal spray, Place 2 sprays into both nostrils daily as needed for allergies or rhinitis., Disp: , Rfl:    ibuprofen (ADVIL,MOTRIN) 800 MG tablet, Take 1 tablet (800 mg total) by mouth every 8 (eight) hours as needed for mild pain., Disp: 30 tablet, Rfl: 0   meclizine (ANTIVERT) 50 MG tablet, Take 1 tablet (50 mg total) by mouth 3 (three) times daily as needed for dizziness., Disp: 30 tablet, Rfl: 0   omeprazole (PRILOSEC) 20 MG capsule, Take 20 mg by mouth daily as needed (for heartburn)., Disp: , Rfl:    ondansetron (ZOFRAN ODT) 4 MG disintegrating tablet, Take 1 tablet (4 mg total) by mouth every 8 (eight) hours as needed for nausea or vomiting., Disp: 20 tablet, Rfl: 0  Social History   Tobacco Use  Smoking Status Former   Types: Cigarettes   Quit date: 12/02/2012   Years since quitting: 9.9  Smokeless Tobacco Never    No Known Allergies Objective:   Vitals:   10/28/22 0833  BP: 128/70   There is no height or weight on file to calculate BMI. Constitutional Well developed. Well nourished.  Vascular Dorsalis pedis pulses palpable bilaterally. Posterior tibial pulses palpable bilaterally. Capillary refill normal to all  digits.  No cyanosis or clubbing noted. Pedal hair growth normal.  Neurologic Normal speech. Oriented to person, place, and time. Epicritic sensation to light touch grossly present bilaterally.  Dermatologic Nails well groomed and normal in appearance. No open wounds. No skin lesions.  Orthopedic: Pain on palpation to the right posterior heel pain with dorsiflexion of the ankle joint no pain with plantarflexion of the ankle joint pain at the insertion of the Achilles.  Positive Silfverskiold test with gastrocnemius equinus positive Haglund's deformity noted.   Radiographs: 3 views of skeletally mature adult right foot:Pes planovalgus foot structure noted with mild posterior spurring and plantar spurring.  Midfoot arthritis noted. Assessment:   1. Right Achilles tendinitis    Plan:  Patient was evaluated and treated and all questions answered.  Right Achilles tendinitis -All questions and concerns were discussed with the patient in extensive detail.  Given the amount of pain that she is and should benefit from cam boot immobilization allow the pain to decrease. -Cam boot was dispensed -If there is no improvement we will discuss steroid injection versus MRI.  For now we will focus on the right side only as that is a primary source of pain the left side is compensation  No follow-ups on file.

## 2022-11-26 ENCOUNTER — Ambulatory Visit: Payer: Managed Care, Other (non HMO) | Admitting: Podiatry

## 2022-11-29 IMAGING — CR DG FOOT 2V*R*
2 series · 2 of 2 positions shown · non-contrast
Comparison: None.

CLINICAL DATA: Pain and swelling of the anterior foot over the last
week.

EXAM:
RIGHT FOOT - 2 VIEW

[x foot ap right]
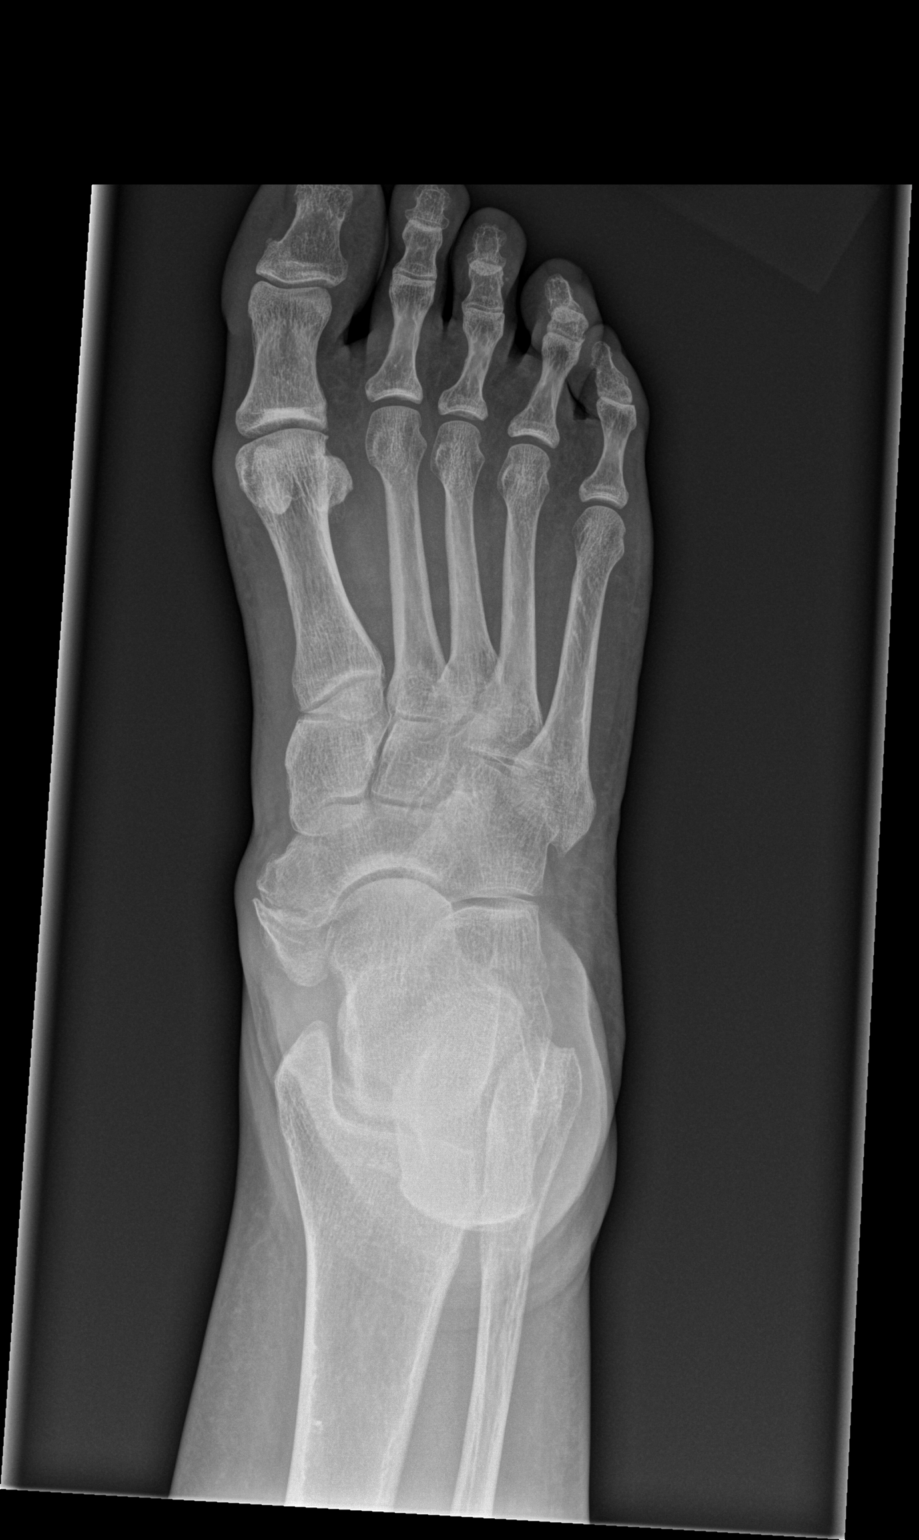

[x foot lat right]
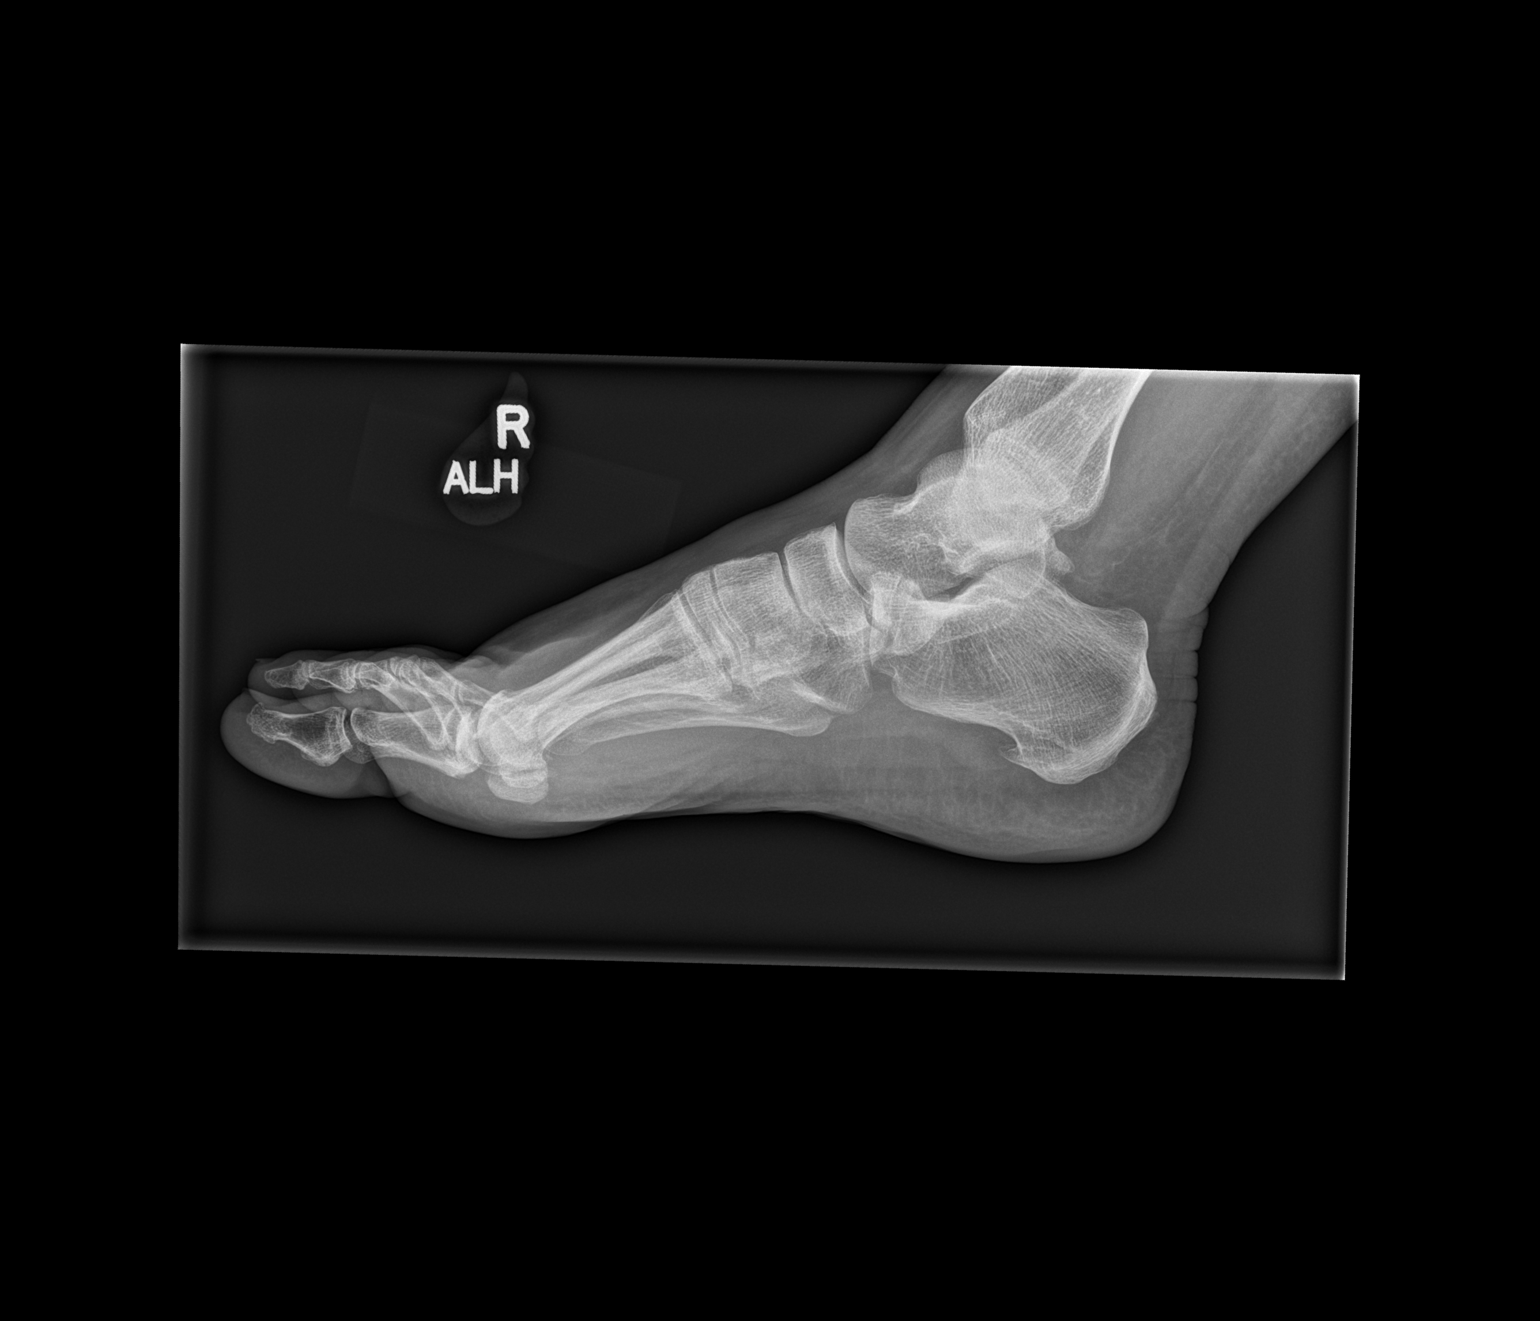

[2 of 2 positions shown; findings below may reference images not displayed]

FINDINGS: There does appear to be soft tissue swelling in the forefoot region.
There is no evidence of regional fracture or stress fracture.
Minimal osteoarthritis of the MTP joint of the great toe is present.
The other joints in the midfoot and forefoot appear normal.
Incidental os tibiale externum, which should not be significant in
this clinical scenario.
IMPRESSION: Nonspecific soft tissue swelling of the forefoot. No cause is
identified. Mild osteoarthritis of the MTP joint of the great toe.

## 2023-01-07 ENCOUNTER — Other Ambulatory Visit: Payer: Self-pay | Admitting: Family Medicine

## 2023-01-07 DIAGNOSIS — Z139 Encounter for screening, unspecified: Secondary | ICD-10-CM

## 2023-02-17 ENCOUNTER — Ambulatory Visit: Payer: Managed Care, Other (non HMO)

## 2023-02-17 ENCOUNTER — Other Ambulatory Visit: Payer: Self-pay | Admitting: Obstetrics and Gynecology

## 2023-02-17 DIAGNOSIS — Z13820 Encounter for screening for osteoporosis: Secondary | ICD-10-CM

## 2023-09-03 ENCOUNTER — Ambulatory Visit
Admission: RE | Admit: 2023-09-03 | Discharge: 2023-09-03 | Disposition: A | Discharge status: Home / Self Care | Payer: Managed Care, Other (non HMO) | Source: Ambulatory Visit | Attending: Obstetrics and Gynecology | Admitting: Obstetrics and Gynecology

## 2023-09-03 DIAGNOSIS — Z13820 Encounter for screening for osteoporosis: Secondary | ICD-10-CM

## 2023-11-24 ENCOUNTER — Other Ambulatory Visit: Payer: Self-pay | Admitting: Family Medicine

## 2023-11-24 DIAGNOSIS — F17201 Nicotine dependence, unspecified, in remission: Secondary | ICD-10-CM

## 2023-12-02 ENCOUNTER — Ambulatory Visit
Admission: RE | Admit: 2023-12-02 | Discharge: 2023-12-02 | Disposition: A | Discharge status: Home / Self Care | Payer: Managed Care, Other (non HMO) | Source: Ambulatory Visit | Attending: Family Medicine | Admitting: Family Medicine

## 2023-12-02 DIAGNOSIS — F17201 Nicotine dependence, unspecified, in remission: Secondary | ICD-10-CM

## 2024-01-13 ENCOUNTER — Other Ambulatory Visit: Payer: Self-pay | Admitting: Gastroenterology

## 2024-01-13 DIAGNOSIS — R1011 Right upper quadrant pain: Secondary | ICD-10-CM

## 2024-01-15 ENCOUNTER — Ambulatory Visit
Admission: RE | Admit: 2024-01-15 | Discharge: 2024-01-15 | Disposition: A | Discharge status: Home / Self Care | Source: Ambulatory Visit | Attending: Gastroenterology | Admitting: Gastroenterology

## 2024-01-15 DIAGNOSIS — R1011 Right upper quadrant pain: Secondary | ICD-10-CM

## 2024-04-29 ENCOUNTER — Other Ambulatory Visit: Payer: Self-pay | Admitting: Student

## 2024-04-29 ENCOUNTER — Ambulatory Visit
Admission: RE | Admit: 2024-04-29 | Discharge: 2024-04-29 | Disposition: A | Discharge status: Home / Self Care | Source: Ambulatory Visit | Attending: Student

## 2024-04-29 DIAGNOSIS — R1013 Epigastric pain: Secondary | ICD-10-CM
